# Patient Record
Sex: Female | Born: 1985 | Race: Black or African American | Hispanic: No | Marital: Single | Smoking: Never smoker
Health system: Southern US, Community
[De-identification: ages and names within clinical notes are randomized; demographics above are authoritative.]

---
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
## 2006-03-18 ENCOUNTER — Inpatient Hospital Stay (HOSPITAL_COMMUNITY): Admission: AD | Admit: 2006-03-18 | Discharge: 2006-03-18 | Payer: Self-pay | Admitting: Family Medicine

## 2006-05-12 ENCOUNTER — Inpatient Hospital Stay (HOSPITAL_COMMUNITY): Admission: AD | Admit: 2006-05-12 | Discharge: 2006-05-12 | Payer: Self-pay | Admitting: Obstetrics

## 2006-06-23 ENCOUNTER — Ambulatory Visit (HOSPITAL_COMMUNITY): Admission: RE | Admit: 2006-06-23 | Discharge: 2006-06-23 | Payer: Self-pay | Admitting: Obstetrics & Gynecology

## 2006-06-30 ENCOUNTER — Inpatient Hospital Stay (HOSPITAL_COMMUNITY): Admission: AD | Admit: 2006-06-30 | Discharge: 2006-06-30 | Payer: Self-pay | Admitting: Obstetrics

## 2006-07-13 ENCOUNTER — Ambulatory Visit (HOSPITAL_COMMUNITY): Admission: RE | Admit: 2006-07-13 | Discharge: 2006-07-13 | Payer: Self-pay | Admitting: Obstetrics & Gynecology

## 2006-08-08 ENCOUNTER — Inpatient Hospital Stay (HOSPITAL_COMMUNITY): Admission: AD | Admit: 2006-08-08 | Discharge: 2006-08-08 | Payer: Self-pay | Admitting: Obstetrics & Gynecology

## 2006-08-20 ENCOUNTER — Inpatient Hospital Stay (HOSPITAL_COMMUNITY): Admission: AD | Admit: 2006-08-20 | Discharge: 2006-08-24 | Payer: Self-pay | Admitting: Obstetrics

## 2006-08-21 ENCOUNTER — Encounter: Payer: Self-pay | Admitting: Obstetrics

## 2006-09-03 ENCOUNTER — Emergency Department (HOSPITAL_COMMUNITY): Admission: EM | Admit: 2006-09-03 | Discharge: 2006-09-04 | Payer: Self-pay | Admitting: Emergency Medicine

## 2006-09-03 ENCOUNTER — Emergency Department (HOSPITAL_COMMUNITY): Admission: EM | Admit: 2006-09-03 | Discharge: 2006-09-03 | Payer: Self-pay | Admitting: Emergency Medicine

## 2006-10-15 ENCOUNTER — Emergency Department (HOSPITAL_COMMUNITY): Admission: EM | Admit: 2006-10-15 | Discharge: 2006-10-15 | Payer: Self-pay | Admitting: Emergency Medicine

## 2006-11-16 ENCOUNTER — Emergency Department (HOSPITAL_COMMUNITY): Admission: EM | Admit: 2006-11-16 | Discharge: 2006-11-16 | Payer: Self-pay | Admitting: Emergency Medicine

## 2007-01-03 ENCOUNTER — Emergency Department (HOSPITAL_COMMUNITY): Admission: EM | Admit: 2007-01-03 | Discharge: 2007-01-03 | Payer: Self-pay | Admitting: Emergency Medicine

## 2007-01-15 ENCOUNTER — Encounter: Payer: Self-pay | Admitting: Obstetrics

## 2007-01-15 ENCOUNTER — Ambulatory Visit (HOSPITAL_COMMUNITY): Admission: AD | Admit: 2007-01-15 | Discharge: 2007-01-15 | Payer: Self-pay | Admitting: Obstetrics

## 2007-05-22 ENCOUNTER — Emergency Department (HOSPITAL_COMMUNITY): Admission: EM | Admit: 2007-05-22 | Discharge: 2007-05-23 | Payer: Self-pay | Admitting: Emergency Medicine

## 2007-05-24 ENCOUNTER — Emergency Department (HOSPITAL_COMMUNITY): Admission: EM | Admit: 2007-05-24 | Discharge: 2007-05-24 | Payer: Self-pay | Admitting: Emergency Medicine

## 2007-05-27 ENCOUNTER — Inpatient Hospital Stay (HOSPITAL_COMMUNITY): Admission: AD | Admit: 2007-05-27 | Discharge: 2007-05-27 | Payer: Self-pay | Admitting: Obstetrics & Gynecology

## 2007-06-02 ENCOUNTER — Inpatient Hospital Stay (HOSPITAL_COMMUNITY): Admission: AD | Admit: 2007-06-02 | Discharge: 2007-06-03 | Payer: Self-pay | Admitting: Obstetrics

## 2007-07-27 ENCOUNTER — Inpatient Hospital Stay (HOSPITAL_COMMUNITY): Admission: AD | Admit: 2007-07-27 | Discharge: 2007-07-28 | Payer: Self-pay | Admitting: Obstetrics & Gynecology

## 2007-08-15 ENCOUNTER — Inpatient Hospital Stay (HOSPITAL_COMMUNITY): Admission: AD | Admit: 2007-08-15 | Discharge: 2007-08-15 | Payer: Self-pay | Admitting: Obstetrics & Gynecology

## 2007-12-20 IMAGING — CR DG CHEST 2V
2 series · 2 of 2 positions shown · non-contrast
Comparison: Chest CT 09/03/06

CLINICAL DATA: Fever, body aches.
 CHEST ? 2 VIEW:

[w chest pa]
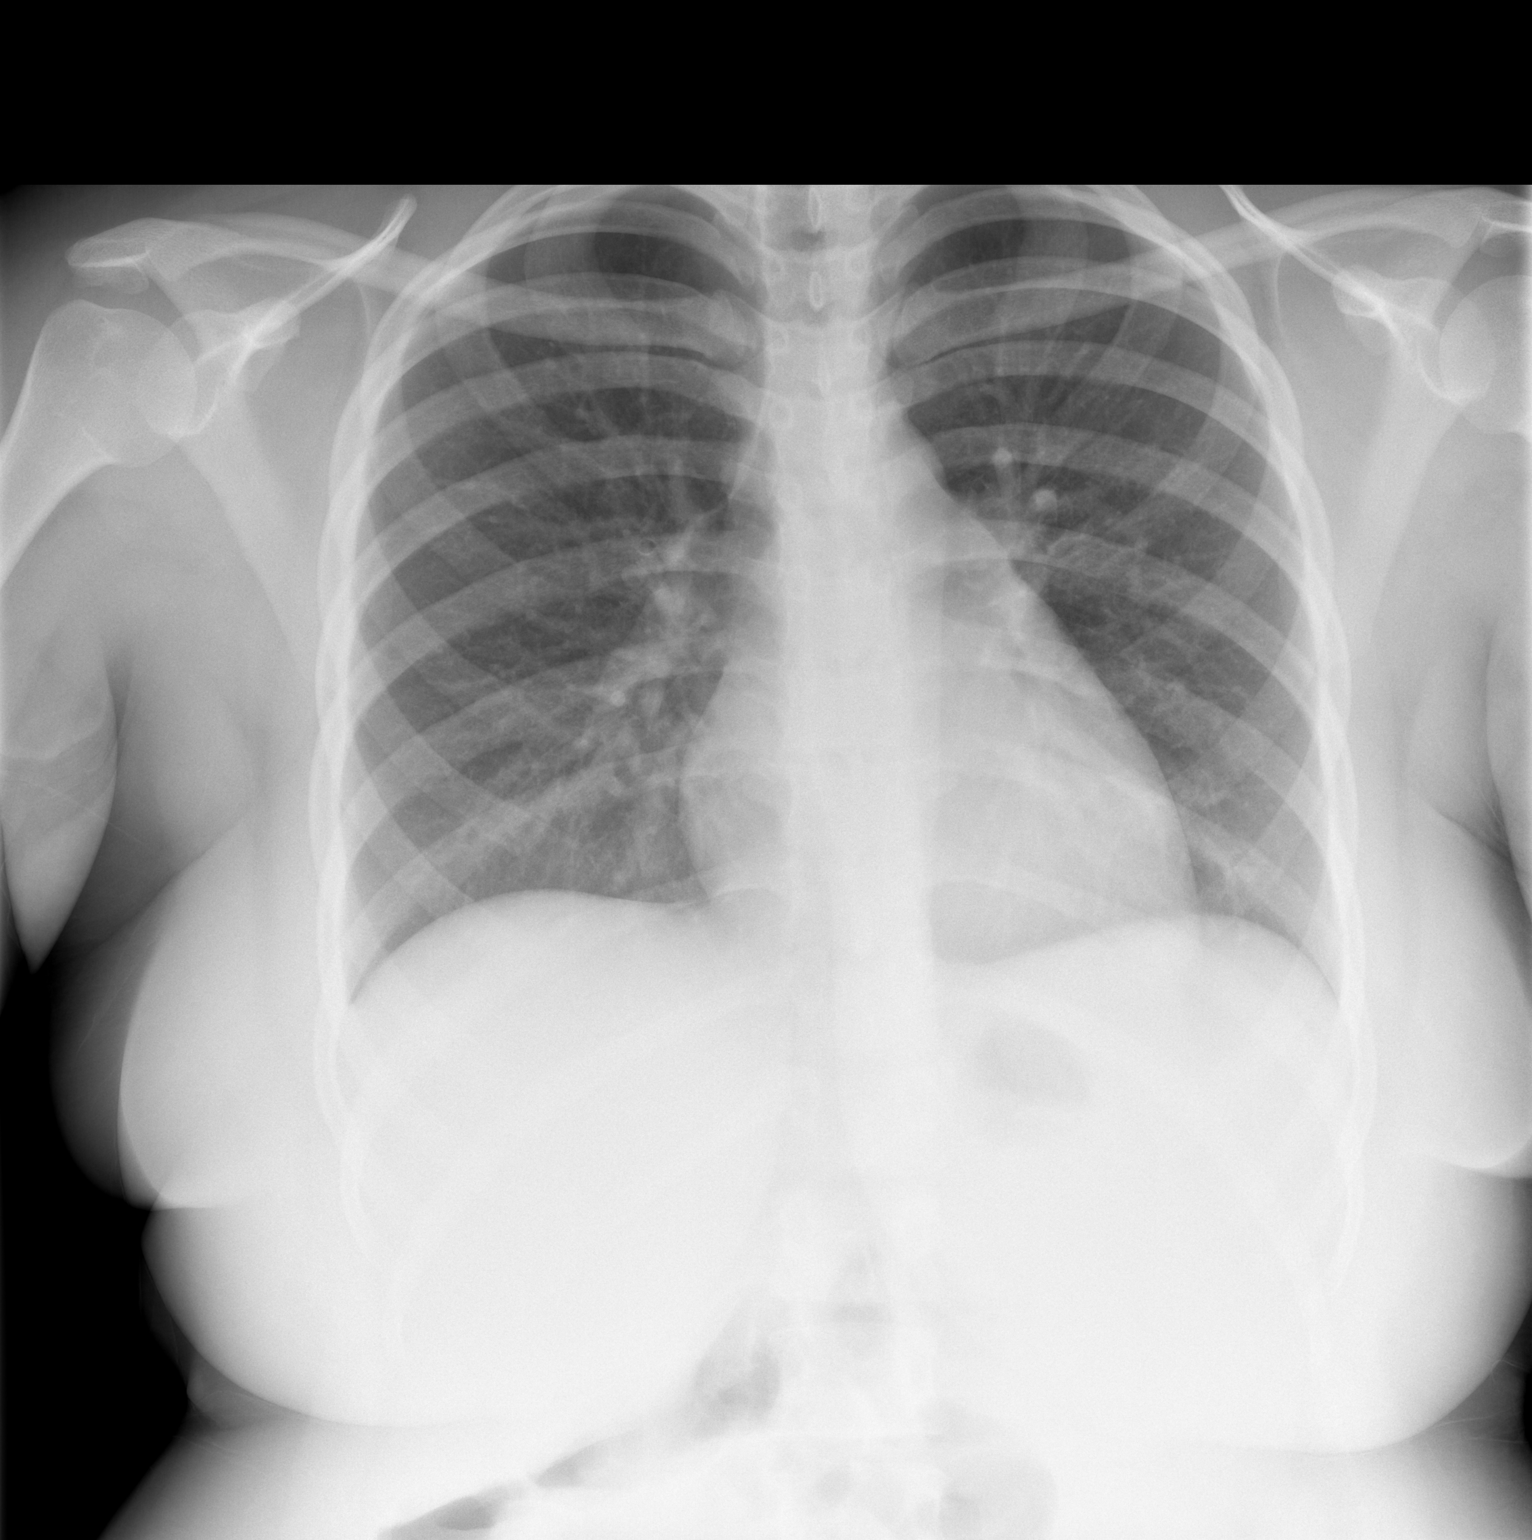

[w chest lat]
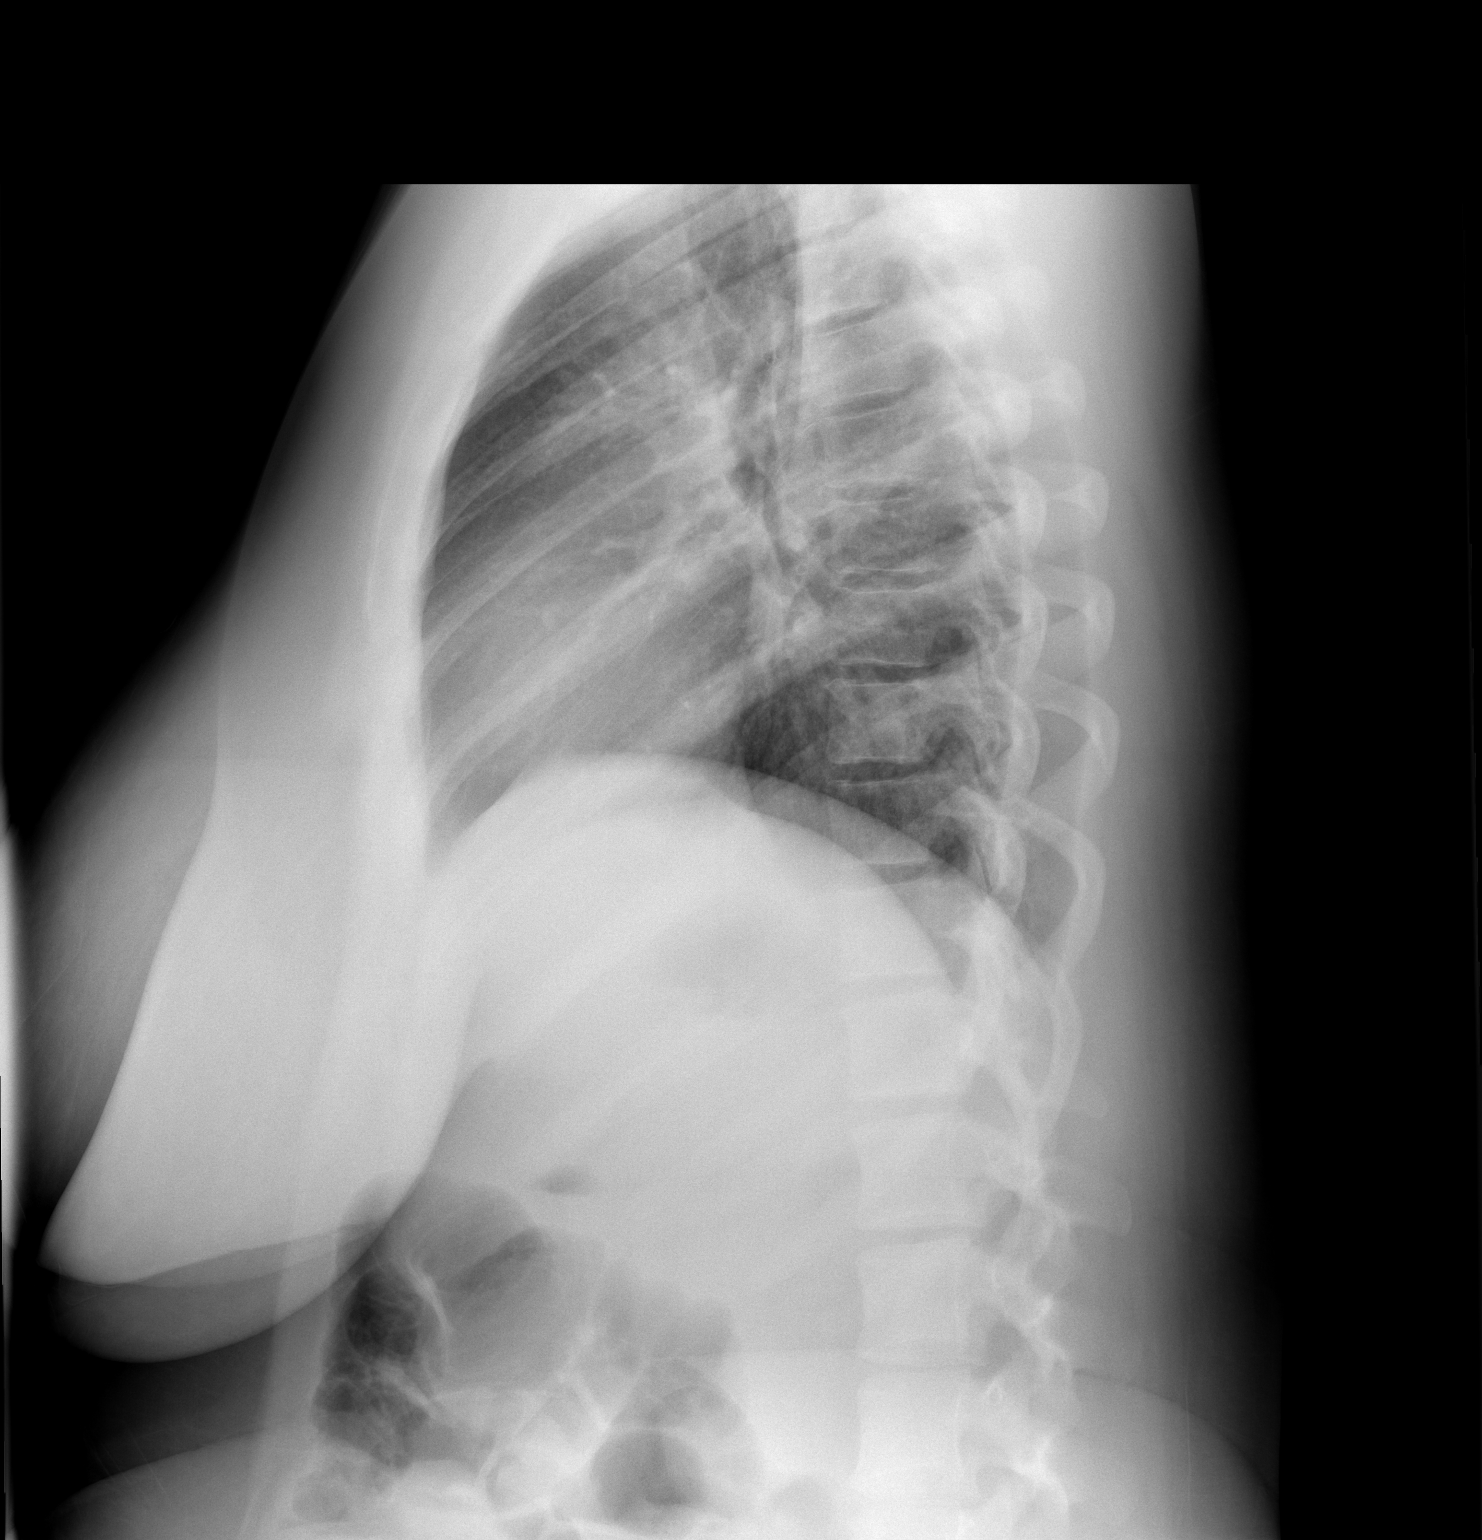

[2 of 2 positions shown; findings below may reference images not displayed]

FINDINGS: Lungs are clear.  Heart size normal.  No effusion or focal bony abnormality.
IMPRESSION: Negative chest.

## 2007-12-20 IMAGING — CT CT ANGIO CHEST
3 of 5 series · 18 of 30 positions shown · IV contrast (120 ML OMNI 300)
Comparison: none

CLINICAL DATA: Post partum in early [REDACTED].  Some shortness of breath.  Elevated D-Dimer.
 CT ANGIOGRAPHY OF CHEST:
TECHNIQUE: Multidetector CT imaging of the chest was performed during bolus injection of intravenous contrast.  Multiplanar CT angiographic image reconstructions were generated to evaluate the vascular anatomy.
 Contrast:  120 cc.

[Series 3: pe · axial · 0.69mm/px · z∈[-219,-29]mm · 9 of 196 slices shown]
[im 22/196  lung]
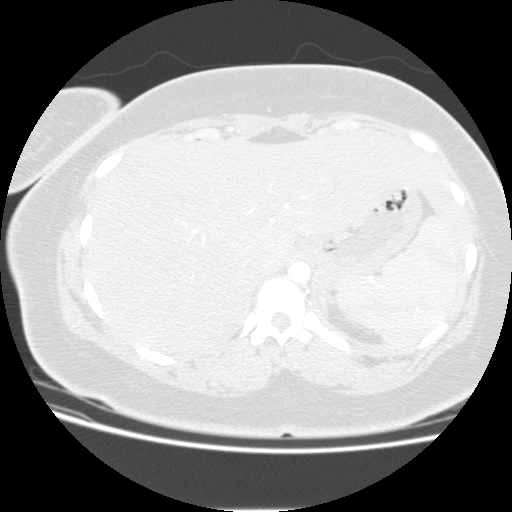
[im 44/196  mediastinal]
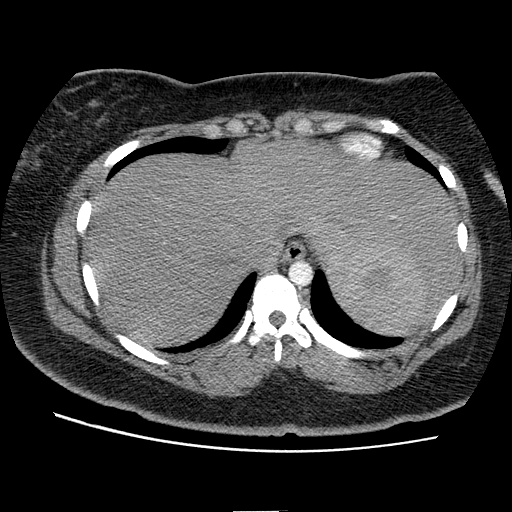
[im 66/196  lung]
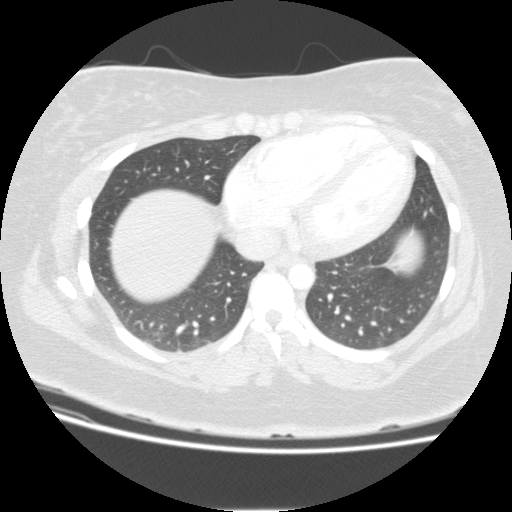
[im 87/196  mediastinal]
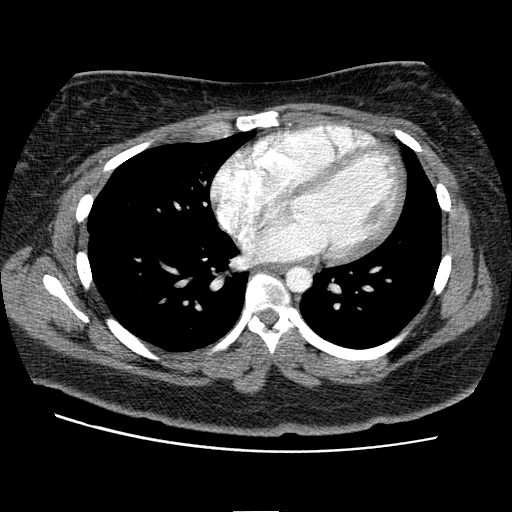
[im 109/196  lung]
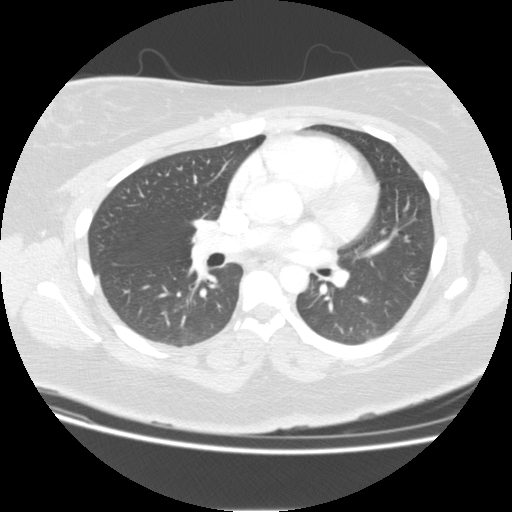
[im 113/196  mediastinal]
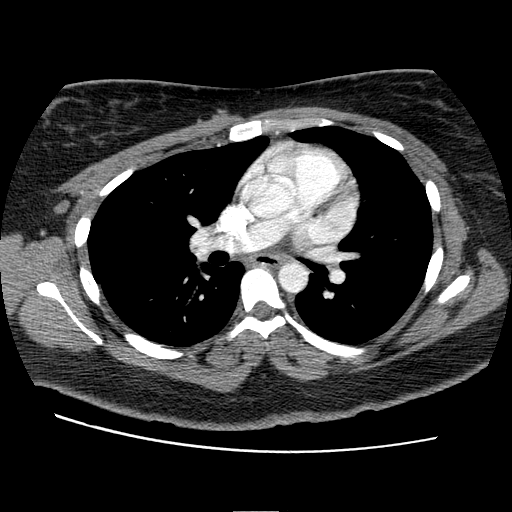
[im 131/196  lung]
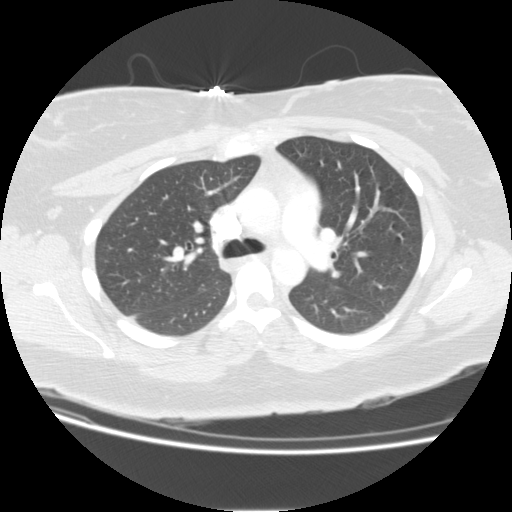
[im 152/196  mediastinal]
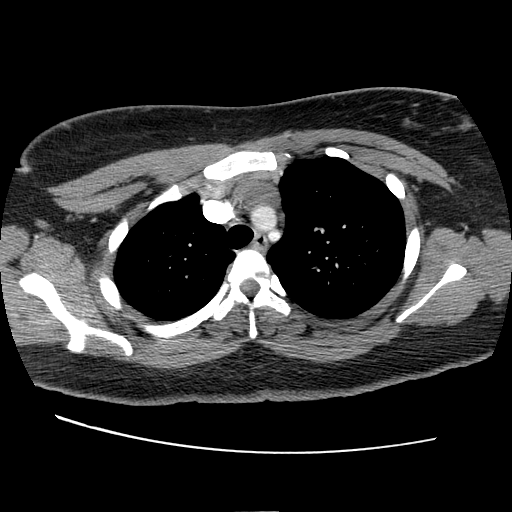
[im 174/196  lung]
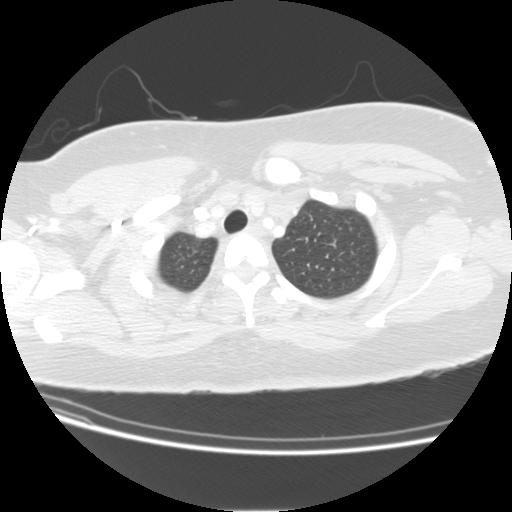

[Series 4: recon 2: pe · axial · 0.69mm/px · z∈[-184,-64]mm · 4 of 98 slices shown]
[im 25/98  lung]
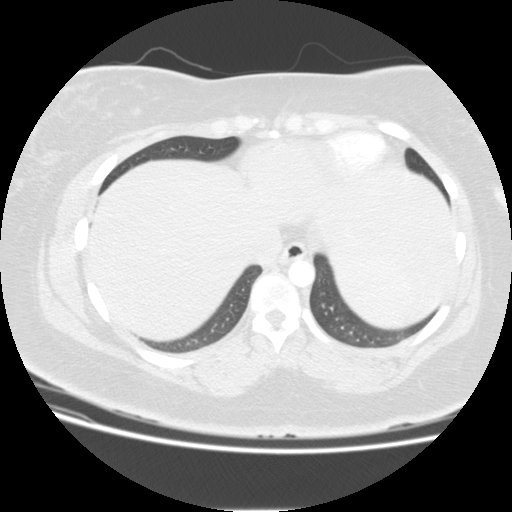
[im 49/98  lung]
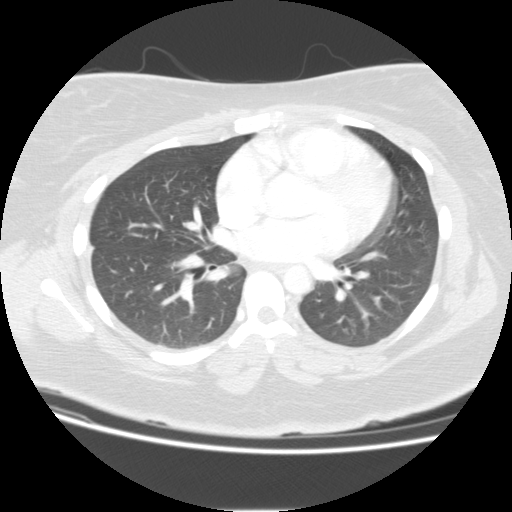
[im 57/98  lung]
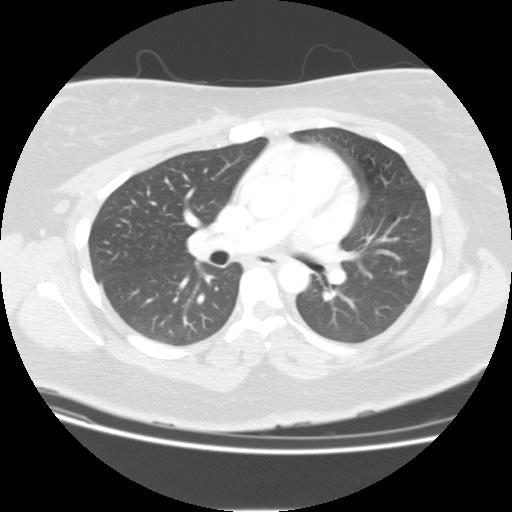
[im 73/98  lung]
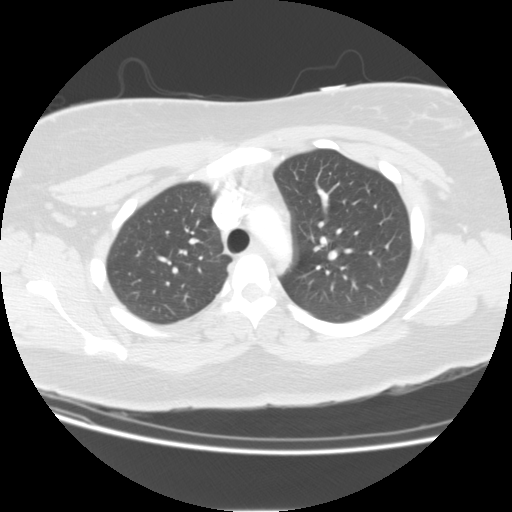

[Series 300: sag angio chest · sagittal · 0.69mm/px · 5 of 135 slices shown]
[im 23/135  lung]
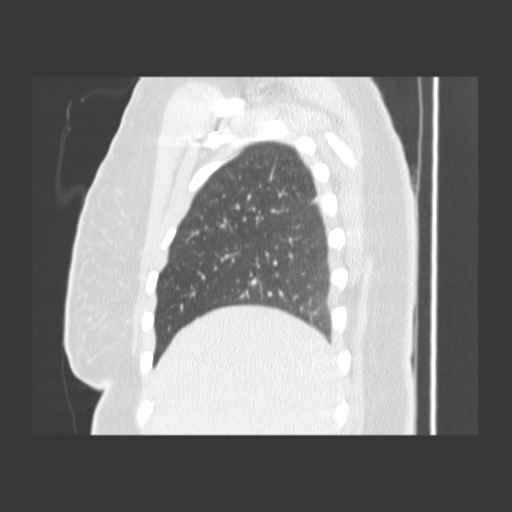
[im 45/135  lung]
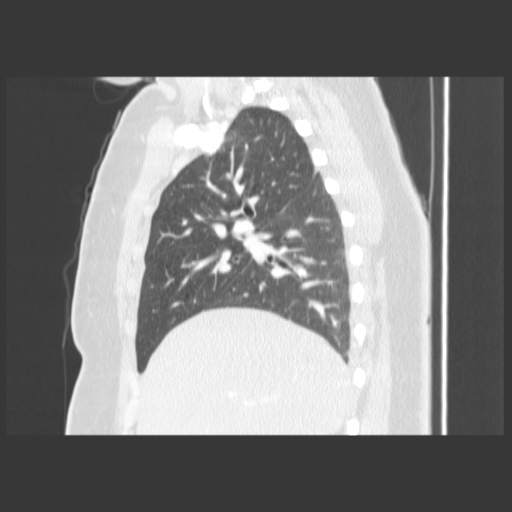
[im 68/135  lung]
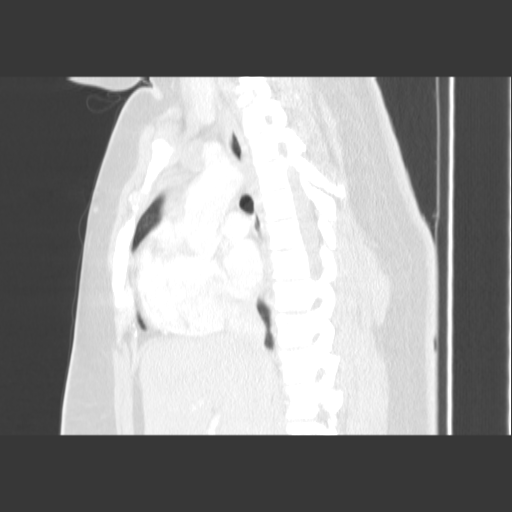
[im 90/135  lung]
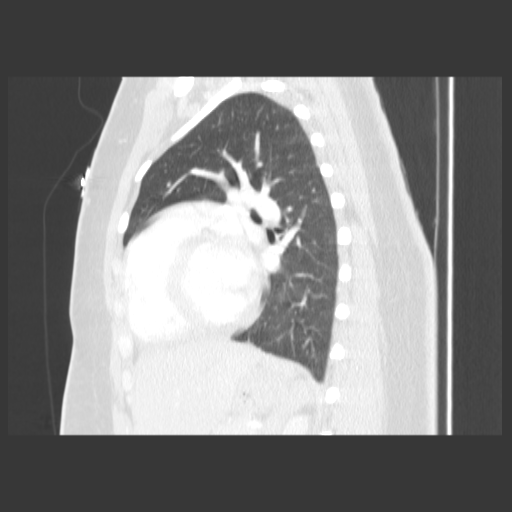
[im 112/135  lung]
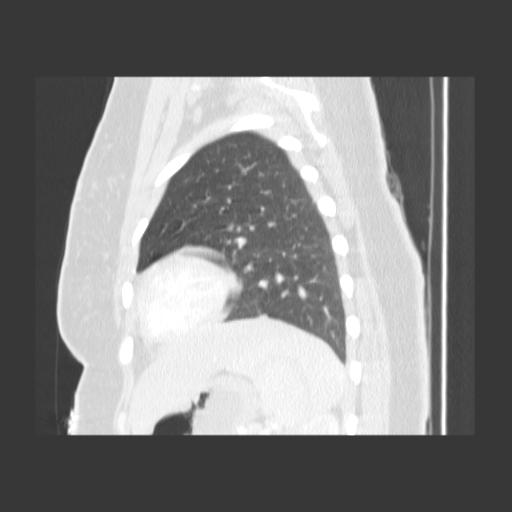

[18 of 30 positions shown; findings below may reference images not displayed]

FINDINGS: On lung window images, no lung parenchymal abnormality is seen.  No effusion is noted.  The pulmonary arteries are relatively well opacified, and there is no evidence of pulmonary embolism.  The thoracic aorta opacifies with no acute abnormality.  The heart is mildly enlarged.  The inhomogeneity of the spleen is most likely due to enhancement pattern.
IMPRESSION: 1. No evidence of pulmonary embolism.
 2. No active infiltrate or effusion. 
 3. Borderline cardiomegaly.

## 2010-02-08 ENCOUNTER — Encounter: Payer: Self-pay | Admitting: Obstetrics & Gynecology

## 2010-06-02 NOTE — Op Note (Signed)
NAMEVIRGILIA, Caroline Henderson             ACCOUNT NO.:  [id number]   MEDICAL RECORD NO.:  [id number]          PATIENT TYPE:  AMB   LOCATION:  SDC                           FACILITY:  [facility]   PHYSICIAN:  Charles A. Clearance Coots, M.D.DATE OF BIRTH:  09-14-1985   DATE OF PROCEDURE:  01/15/2007  DATE OF DISCHARGE:                               OPERATIVE REPORT   PREOPERATIVE DIAGNOSIS:  Spontaneous abortion, incomplete, first  trimester.   POSTOPERATIVE DIAGNOSIS:  Spontaneous abortion, incomplete, first  trimester.   PROCEDURE:  Suction evacuation of retained placenta.   SURGEON:  Charles A. Clearance Coots, M.D.   ANESTHESIA:  MAC, with paracervical block.   ESTIMATED BLOOD LOSS:  100 mL.   COMPLICATIONS:  None.   SPECIMEN:  Products of conception.   OPERATION:  The patient was brought to the operating room, and after  satisfactory IV sedation, the legs were brought up in stirrups, and the  vagina was prepped and draped in the usual sterile fashion.  The urinary  bladder was emptied of approximately 100 mL of clear urine.  Bimanual  examination revealed the uterus to be anteverted, slightly enlarged.  Sterile speculum was inserted in the vaginal vault, and the cervix was  isolated and noted to be open.  The anterior lip of the cervix was  grasped with a ring forceps.  Paracervical block of approximately 10 mL  1% Xylocaine with 1 mL of sodium bicarbonate was placed in each lateral  fornix.  A #12 suction catheter was then easily introduced into the  uterine cavity, and all contents were evacuated.  The endometrial  surface felt gritty at the conclusion of the evacuation procedure.  There was no active bleeding at the conclusion of the procedure.  All  instruments were retired.  The uterus felt firm at the conclusion of the  procedure.  The patient tolerated the procedure well and was transported  to recovery room in satisfactory condition.      Charles A. Clearance Coots, M.D.  Electronically  Signed     CAH/MEDQ  D:  01/15/2007  T:  01/16/2007  Job:  [id number]

## 2010-06-02 NOTE — Discharge Summary (Signed)
Caroline Henderson, Caroline Henderson             ACCOUNT NO.:  [id number]   MEDICAL RECORD NO.:  [id number]          PATIENT TYPE:  INP   LOCATION:  9311                          FACILITY:  [facility]   PHYSICIAN:  Charles A. Clearance Coots, M.D.DATE OF BIRTH:  January 20, 1985   DATE OF ADMISSION:  08/20/2006  DATE OF DISCHARGE:  08/24/2006                               DISCHARGE SUMMARY   ADMITTING DIAGNOSES:  1. [redacted] weeks gestation, active labor, transverse lie, status post      classical cesarean section on August 21, 2006 with delivery of a      viable female at 13.  Apgars of 2 at 1 minute, 3 at 5 minutes, 4 at      10 minutes, 5 at 15 minutes.  2. Neonatal demise.   REASON FOR ADMISSION:  25 year old G2, P1 black female, last menstrual  period February 13, 2006 who presented to triage at [redacted] weeks gestation  with complaint of cramping and vaginal bleeding.  The patient had had  sexual intercourse earlier in the evening and then after dinner noted  the onset of cramping and vaginal bleeding.  She had had episodes of  vaginal bleeding earlier in the pregnancy and was followed at [facility] and had recent ultrasounds at [redacted] weeks gestation  and [redacted] weeks gestation which were within normal limits.   PAST MEDICAL HISTORY:  None.   PAST SURGICAL HISTORY:  None.   MEDICATIONS:  Prenatal vitamins.   ALLERGIES:  NO KNOWN DRUG ALLERGIES   SOCIAL HISTORY:  Single.  Negative tobacco, alcohol, or recreational  drug use.   PHYSICAL EXAMINATION:  GENERAL:  Well-nourished, well-developed female  in no acute distress.  VITAL SIGNS:  She is afebrile, vital signs are stable.  LUNGS:  Clear to auscultation bilaterally.  HEART:  Regular rate and rhythm.  ABDOMEN:  Gravid, nontender.  Cervix fully dilated, 100% effaced, with  bulging membranes.   Ultrasound revealed a transverse lie.  The patient was taken to the  operating room and an urgent classical cesarean section delivery was  performed.  A Viable female  infant was delivered, with Apgars of 2 at 1  minute, 3 at 5 minutes, 4 at 10 minutes, and 5 at 15 minutes.  There  were no intraoperative complications.   Postoperative course was uncomplicated.  There was neonatal demise  several hours after delivery due to prematurity.  The patient was  discharged home on postop day #3 with instructions for future deliveries  being restricted to cesarean section delivery because of the classical  incision in the uterus for this delivery.  She expresses understanding  of these instructions.   DISCHARGE LABORATORY VALUES:  Hemoglobin 9.3, hematocrit 27.9, white  blood cell count 8300, platelets 182,000.   DISCHARGE MEDICATIONS:  1. Tylox and ibuprofen was prescribed for pain.  2. Vistaril was prescribed for anxiety.  3. Continue prenatal vitamins.   DISCHARGE INSTRUCTIONS:  Routine written instructions were given for  discharge after cesarean section, and it was again emphasized to the  patient that she should only have cesarean section deliveries in the  future because  of the classical incision on the uterus with this  delivery.  The patient is to call office for followup appointment in 2  weeks.      Charles A. Clearance Coots, M.D.  Electronically Signed     CAH/MEDQ  D:  08/25/2006  T:  08/25/2006  Job:  [id number]

## 2010-06-05 NOTE — Discharge Summary (Signed)
NAMEAUDIA, Caroline Henderson             ACCOUNT NO.:  [id number]   MEDICAL RECORD NO.:  [id number]          PATIENT TYPE:  INP   LOCATION:  9311                          [facility]ACILITY:  [facility]   PHYSICIAN:  Charles A. Clearance Coots, M.D.DATE OF BIRTH:  02/12/1985   DATE OF ADMISSION:  08/20/2006  DATE OF DISCHARGE:  08/24/2006                               DISCHARGE SUMMARY   ADMITTING DIAGNOSIS:  A 26 weeks' gestation in active labor, transverse  lie.   DISCHARGE DIAGNOSIS:  1. A 26 weeks' gestation in active labor, transverse lie.  2. Status post classical cesarean section on August 21, 2006.  A viable      female delivered at 56.  Apgars of 2 at one minute, 3 at five      minutes 4 at ten minutes and 5 at fifteen minutes with neonatal      demise.  Mother discharged home on postop day #3 in good condition.   REASON FOR ADMISSION:  A 25 year old black female.  Last menstrual  period was February 13, 2006 presented to the hospital at [redacted] weeks  gestation with complaint of vaginal bleeding and cramping after  intercourse.  The patient had intercourse earlier in the evening and  noted the onset of cramping and vaginal bleeding thereafter.  The  patient's prenatal course had been complicated by episodes of vaginal  bleeding.  Ultrasounds which were done at 21 weeks' gestation and at [redacted]  weeks gestation were within normal limits with normal placentation   PAST MEDICAL HISTORY:  Surgery none.  Illnesses none.   MEDICATIONS:  Prenatal vitamins.   ALLERGIES:  No known drug allergies.   SOCIAL HISTORY:  Single negative tobacco, alcohol, or recreational drug  use.   PHYSICAL EXAM:  GENERAL:  A well-nourished, well-developed female no  acute distress.  VITAL SIGNS:  Afebrile.  Vital signs were stable.  LUNGS:  Clear to auscultation bilaterally.  HEART:  Regular rate and rhythm.  ABDOMEN:  Gravid nontender.  CERVICAL EXAM:  Revealed bulging membranes down to the introitus.  Cervix was 100% effaced,  presenting part could not be felt digitally.  Ultrasound was obtained and revealed a transverse lie.  The patient was  taken to the operating room for cesarean section delivery for a 26  weeks' gestation, active labor, transverse lie.   HOSPITAL COURSE:  Classical cesarean section delivery was performed on  August 21, 2006.  There were no intraoperative complications.  Postoperative course was uncomplicated.  The patient was discharged home  on postop day #3 in good condition.  Discharge laboratory values  hemoglobin 9.3, hematocrit 27.9 white blood cell count 8300, platelets  182,000.   DISCHARGE DISPOSITION/MEDICATIONS:  Tylox and ibuprofen were prescribed  for pain and continued prenatal vitamins.  Routine written instructions  were given for discharge after cesarean section.  The patient was  counseled regarding the type of cesarean section that she receive being  a classical scar in the uterus from her  cesarean section; and that all future deliveries should be done by  cesarean section delivery because of the classical approach to the  incision and  this cesarean section with the increased risks of uterine  rupture with any trial of labor for future deliveries.  The patient was  instructed to call office for a followup appointment in 2 weeks.      Charles A. Clearance Coots, M.D.  Electronically Signed     CAH/MEDQ  D:  09/15/2006  T:  09/16/2006  Job:  [id number]

## 2010-10-14 LAB — WET PREP, GENITAL
Trich, Wet Prep: NONE SEEN
Yeast Wet Prep HPF POC: NONE SEEN

## 2010-10-14 LAB — URINALYSIS, ROUTINE W REFLEX MICROSCOPIC
Nitrite: NEGATIVE
Specific Gravity, Urine: >1.03 — ABNORMAL HIGH
Urobilinogen, UA: 0.2

## 2010-10-15 LAB — GC/CHLAMYDIA PROBE AMP, GENITAL
Chlamydia, DNA Probe: NEGATIVE
GC Probe Amp, Genital: NEGATIVE

## 2010-10-16 LAB — URINALYSIS, ROUTINE W REFLEX MICROSCOPIC
Bilirubin Urine: NEGATIVE
Glucose, UA: NEGATIVE
Hgb urine dipstick: NEGATIVE
Ketones, ur: NEGATIVE
pH: 6

## 2010-10-23 LAB — CBC
HCT: 35.4 — ABNORMAL LOW
Hemoglobin: 11.9 — ABNORMAL LOW
MCV: 76.8 — ABNORMAL LOW
RDW: 15.7 — ABNORMAL HIGH

## 2010-10-28 LAB — BASIC METABOLIC PANEL
BUN: 13
Calcium: 9.5
Creatinine, Ser: 0.73
GFR calc Af Amer: >60

## 2010-10-28 LAB — HEPATIC FUNCTION PANEL
ALT: 22
AST: 22
Albumin: 3.9
Alkaline Phosphatase: 65
Bilirubin, Direct: 0.1
Indirect Bilirubin: 0.8
Total Bilirubin: 0.9
Total Protein: 7

## 2010-10-28 LAB — URINE MICROSCOPIC-ADD ON

## 2010-10-28 LAB — URINALYSIS, ROUTINE W REFLEX MICROSCOPIC
Glucose, UA: NEGATIVE
Specific Gravity, Urine: 1.039 — ABNORMAL HIGH
pH: 6

## 2010-10-29 LAB — POCT PREGNANCY, URINE
Operator id: 29459
Preg Test, Ur: NEGATIVE

## 2010-10-30 LAB — I-STAT 8, (EC8 V) (CONVERTED LAB)
BUN: 6
BUN: 9
Bicarbonate: 18.9 — ABNORMAL LOW
Bicarbonate: 23.5
Chloride: 107
Chloride: 108
Glucose, Bld: 114 — ABNORMAL HIGH
Glucose, Bld: 89
HCT: 33 — ABNORMAL LOW
HCT: 35 — ABNORMAL LOW
Hemoglobin: 11.9 — ABNORMAL LOW
Operator id: 151321
Operator id: 257131
Potassium: 3.6
Sodium: 140
TCO2: 19
pCO2, Ven: 17.9 — ABNORMAL LOW
pCO2, Ven: 35.7 — ABNORMAL LOW
pH, Ven: 7.63

## 2010-10-30 LAB — CBC
HCT: 31.4 — ABNORMAL LOW
Hemoglobin: 10.4 — ABNORMAL LOW
MCV: 80.1
RBC: 3.92
WBC: 13.1 — ABNORMAL HIGH

## 2010-10-30 LAB — URINE MICROSCOPIC-ADD ON

## 2010-10-30 LAB — DIFFERENTIAL
Eosinophils Absolute: 0
Eosinophils Relative: 0
Lymphocytes Relative: 5 — ABNORMAL LOW
Lymphs Abs: 0.7
Monocytes Absolute: 0.9 — ABNORMAL HIGH
Monocytes Relative: 7

## 2010-10-30 LAB — POCT PREGNANCY, URINE
Operator id: 151321
Preg Test, Ur: NEGATIVE

## 2010-10-30 LAB — POCT I-STAT CREATININE
Creatinine, Ser: 0.7
Operator id: 151321

## 2010-10-30 LAB — URINALYSIS, ROUTINE W REFLEX MICROSCOPIC
Bilirubin Urine: NEGATIVE
Glucose, UA: NEGATIVE
Specific Gravity, Urine: >1.046 — ABNORMAL HIGH
Urobilinogen, UA: 1

## 2010-10-30 LAB — URINE CULTURE: Colony Count: >=100000

## 2010-11-02 LAB — URINALYSIS, ROUTINE W REFLEX MICROSCOPIC
Bilirubin Urine: NEGATIVE
Glucose, UA: NEGATIVE
Ketones, ur: 15 — AB
Ketones, ur: 15 — AB
Leukocytes, UA: NEGATIVE
Nitrite: NEGATIVE
Specific Gravity, Urine: 1.025
Urobilinogen, UA: 1
pH: 6

## 2010-11-02 LAB — URINE MICROSCOPIC-ADD ON

## 2010-11-02 LAB — CBC
HCT: 27.9 — ABNORMAL LOW
MCHC: 33.5
MCV: 80.9
RBC: 3.45 — ABNORMAL LOW
WBC: 8.3

## 2010-11-02 LAB — FETAL FIBRONECTIN: Fetal Fibronectin: NEGATIVE

## 2010-11-05 LAB — URINALYSIS, ROUTINE W REFLEX MICROSCOPIC
Nitrite: NEGATIVE
Specific Gravity, Urine: >1.03 — ABNORMAL HIGH
Urobilinogen, UA: 0.2

## 2010-11-17 ENCOUNTER — Emergency Department (HOSPITAL_COMMUNITY)
Admission: EM | Admit: 2010-11-17 | Discharge: 2010-11-17 | Disposition: A | Discharge status: Home / Self Care | Payer: Self-pay | Attending: Emergency Medicine | Admitting: Emergency Medicine

## 2010-11-17 DIAGNOSIS — R109 Unspecified abdominal pain: Secondary | ICD-10-CM | POA: Insufficient documentation

## 2010-11-17 DIAGNOSIS — R197 Diarrhea, unspecified: Secondary | ICD-10-CM | POA: Insufficient documentation

## 2010-11-17 LAB — URINE MICROSCOPIC-ADD ON

## 2010-11-17 LAB — URINALYSIS, ROUTINE W REFLEX MICROSCOPIC
Nitrite: NEGATIVE
Protein, ur: NEGATIVE mg/dL
Urobilinogen, UA: 0.2 mg/dL (ref 0.0–1.0)

## 2010-11-17 LAB — CBC
HCT: 33.6 % — ABNORMAL LOW (ref 36.0–46.0)
MCV: 78.9 fL (ref 78.0–100.0)
RDW: 13.9 % (ref 11.5–15.5)
WBC: 9.2 10*3/uL (ref 4.0–10.5)

## 2010-11-17 LAB — COMPREHENSIVE METABOLIC PANEL
Albumin: 3.8 g/dL (ref 3.5–5.2)
BUN: 15 mg/dL (ref 6–23)
Chloride: 103 mEq/L (ref 96–112)
Creatinine, Ser: 0.68 mg/dL (ref 0.50–1.10)
GFR calc Af Amer: >90 mL/min (ref >90)
GFR calc non Af Amer: >90 mL/min (ref >90)
Glucose, Bld: 88 mg/dL (ref 70–99)
Total Bilirubin: 0.4 mg/dL (ref 0.3–1.2)

## 2010-11-17 LAB — LIPASE, BLOOD: Lipase: 22 U/L (ref 11–59)

## 2013-02-20 ENCOUNTER — Encounter (HOSPITAL_COMMUNITY): Payer: Self-pay | Admitting: Emergency Medicine

## 2013-02-20 ENCOUNTER — Emergency Department (HOSPITAL_COMMUNITY)
Admission: EM | Admit: 2013-02-20 | Discharge: 2013-02-21 | Disposition: A | Discharge status: Home / Self Care | Payer: Self-pay | Attending: Emergency Medicine | Admitting: Emergency Medicine

## 2013-02-20 DIAGNOSIS — L0231 Cutaneous abscess of buttock: Secondary | ICD-10-CM | POA: Insufficient documentation

## 2013-02-20 DIAGNOSIS — R000 Tachycardia, unspecified: Secondary | ICD-10-CM | POA: Insufficient documentation

## 2013-02-20 DIAGNOSIS — L03317 Cellulitis of buttock: Principal | ICD-10-CM

## 2013-02-20 NOTE — Discharge Instructions (Signed)
Abscess Care After An abscess (also called a boil or furuncle) is an infected area that contains a collection of pus. Signs and symptoms of an abscess include pain, tenderness, redness, or hardness, or you may feel a moveable soft area under your skin. An abscess can occur anywhere in the body. The infection may spread to surrounding tissues causing cellulitis. A cut (incision) by the surgeon was made over your abscess and the pus was drained out. Gauze may have been packed into the space to provide a drain that will allow the cavity to heal from the inside outwards. The boil may be painful for 5 to 7 days. Most people with a boil do not have high fevers. Your abscess, if seen early, may not have localized, and may not have been lanced. If not, another appointment may be required for this if it does not get better on its own or with medications. HOME CARE INSTRUCTIONS   Only take over-the-counter or prescription medicines for pain, discomfort, or fever as directed by your caregiver.  When you bathe, soak and then remove gauze or iodoform packs at least daily or as directed by your caregiver. You may then wash the wound gently with mild soapy water. Repack with gauze or do as your caregiver directs. SEEK IMMEDIATE MEDICAL CARE IF:   You develop increased pain, swelling, redness, drainage, or bleeding in the wound site.  You develop signs of generalized infection including muscle aches, chills, fever, or a general ill feeling.  An oral temperature above 102 F (38.9 C) develops, not controlled by medication. See your caregiver for a recheck if you develop any of the symptoms described above. If medications (antibiotics) were prescribed, take them as directed. Document Released: 07/23/2004 Document Revised: 03/29/2011 Document Reviewed: 03/20/2007 [facility] Patient Information 2014 [place], [place]. Your abscess was opened and drained, you should not need any further treatment, you can take  over-the-counter ibuprofen or Tylenol for discomfort.  Please apply a warm compress to the area several times a day for 15-20 minutes for the next several days

## 2013-02-20 NOTE — ED Provider Notes (Signed)
CSN: [id number]     Arrival date & time 02/20/13  2307 History  This chart was scribed for non-physician practitioner Arman FilterGail K Undrea Archbold, NP working with Suzi RootsKevin E Steinl, MD by Donne Anonayla Curran, ED Scribe. This patient was seen in room WTR5/WTR5 and the patient's care was started at 2323.    First MD Initiated Contact with Patient 02/20/13 2323     Chief Complaint  Patient presents with  . Abscess    The history is provided by the patient. No language interpreter was used.   HPI Comments: Caroline Henderson is a 28 y.o. female who presents to the Emergency Department complaining of 3 days of a gradual onset, gradually worsening abscess to her right buttock. She has tried warm compresses with little relief. She denies drainage from the area or any other symptoms. She reports her only history of abscess was a single abscess 1 week ago (under her arm) that resolved by itself. She denies a hx of DM.    History reviewed. No pertinent past medical history. Past Surgical History  Procedure Laterality Date  . Cesarean section     No family history on file. History  Substance Use Topics  . Smoking status: Never Smoker   . Smokeless tobacco: Not on file  . Alcohol Use: No   OB History   Grav Para Term Preterm Abortions TAB SAB Ect Mult Living                 Review of Systems  Constitutional: Negative for fever.  Musculoskeletal: Negative for myalgias.  Skin: Positive for color change and wound.  All other systems reviewed and are negative.    Allergies  Review of patient's allergies indicates no known allergies.  Home Medications  No current outpatient prescriptions on file.  BP 106/72  Pulse 112  Temp(Src) 98.4 F (36.9 C) (Oral)  Resp 18  Ht 5\' 4"  (1.626 m)  Wt 230 lb (104.327 kg)  BMI 39.46 kg/m2  SpO2 100%  LMP 02/13/2013  Physical Exam  Nursing note and vitals reviewed. Constitutional: She appears well-developed and well-nourished. No distress.  HENT:  Head:  Normocephalic and atraumatic.  Eyes: Conjunctivae are normal.  Neck: Normal range of motion.  Cardiovascular: Regular rhythm.  Tachycardia present.   Pulmonary/Chest: Effort normal. No respiratory distress.  Musculoskeletal: Normal range of motion. She exhibits no edema and no tenderness.  Neurological: She is alert.  Skin: Skin is warm and dry. There is erythema.  2 cm x 2 cm round firm area with a white center. No fluctuance.   Psychiatric: She has a normal mood and affect. Her behavior is normal.    ED Course  Procedures (including critical care time) DIAGNOSTIC STUDIES: Oxygen Saturation is 100% on RA, normal by my interpretation.    COORDINATION OF CARE: 11:24 PM Discussed treatment plan which includes I&D with pt at bedside and pt agreed to plan. Advised pt to use warm compresses several times a day. Will discharge home with an antibiotic and pain medication.   INCISION AND DRAINAGE Performed by: Arman FilterGail K Lea Baine, NP Consent: Verbal consent obtained. Risks and benefits: risks, benefits and alternatives were discussed Type: abscess  Body area: right buttock  Anesthesia: local infiltration  Incision was made with a scalpel.11 blade  Local anesthetic: lidocaine 1% without epinephrine  Anesthetic total: 2 ml  Complexity: complex Blunt dissection to break up loculations  Drainage: purulent  Drainage amount: moderate  Packing material: none  Patient tolerance: Patient tolerated the procedure  well with no immediate complications.      Labs Review Labs Reviewed - No data to display Imaging Review No results found.  EKG Interpretation   None       MDM   1. Abscess of buttock, right       I personally performed the services described in this documentation, which was scribed in my presence. The recorded information has been reviewed and is accurate.    Arman Filter, NP 02/20/13 [phone]

## 2013-02-20 NOTE — ED Notes (Signed)
Pt c/o Boil to rt buttock that has progressively gotten worse over the last 3 days; pt denies drainage from abscess

## 2013-02-21 MED ORDER — IBUPROFEN 200 MG PO TABS
600.0000 mg | ORAL_TABLET | Freq: Once | ORAL | Status: AC
  Administered 2013-02-21: 600 mg via ORAL
  Filled 2013-02-21: qty 3

## 2013-02-25 NOTE — ED Provider Notes (Signed)
Medical screening examination/treatment/procedure(s) were performed by non-physician practitioner and as supervising physician I was immediately available for consultation/collaboration.  EKG Interpretation   None         Suzi RootsKevin E Maalik Pinn, MD 02/25/13 0830

## 2013-04-12 ENCOUNTER — Ambulatory Visit
Admission: RE | Admit: 2013-04-12 | Discharge: 2013-04-12 | Disposition: A | Discharge status: Home / Self Care | Payer: No Typology Code available for payment source | Source: Ambulatory Visit | Attending: Infectious Disease | Admitting: Infectious Disease

## 2013-04-12 ENCOUNTER — Other Ambulatory Visit: Payer: Self-pay | Admitting: Infectious Disease

## 2013-04-12 DIAGNOSIS — A150 Tuberculosis of lung: Secondary | ICD-10-CM

## 2015-07-27 ENCOUNTER — Encounter (HOSPITAL_COMMUNITY): Payer: Self-pay | Admitting: Oncology

## 2015-07-27 ENCOUNTER — Emergency Department (HOSPITAL_COMMUNITY)
Admission: EM | Admit: 2015-07-27 | Discharge: 2015-07-27 | Disposition: A | Discharge status: Home / Self Care | Payer: No Typology Code available for payment source | Attending: Emergency Medicine | Admitting: Emergency Medicine

## 2015-07-27 DIAGNOSIS — Y999 Unspecified external cause status: Secondary | ICD-10-CM | POA: Insufficient documentation

## 2015-07-27 DIAGNOSIS — Y92410 Unspecified street and highway as the place of occurrence of the external cause: Secondary | ICD-10-CM | POA: Diagnosis not present

## 2015-07-27 DIAGNOSIS — Y939 Activity, unspecified: Secondary | ICD-10-CM | POA: Diagnosis not present

## 2015-07-27 DIAGNOSIS — S39012A Strain of muscle, fascia and tendon of lower back, initial encounter: Secondary | ICD-10-CM | POA: Insufficient documentation

## 2015-07-27 DIAGNOSIS — S3992XA Unspecified injury of lower back, initial encounter: Secondary | ICD-10-CM | POA: Diagnosis present

## 2015-07-27 MED ORDER — METHOCARBAMOL 500 MG PO TABS: 500.0000 mg | ORAL_TABLET | Freq: Two times a day (BID) | ORAL | Status: AC | PRN

## 2015-07-27 MED ORDER — NAPROXEN 500 MG PO TABS
500.0000 mg | ORAL_TABLET | Freq: Once | ORAL | Status: AC
  Administered 2015-07-27: 500 mg via ORAL
  Filled 2015-07-27: qty 1

## 2015-07-27 MED ORDER — NAPROXEN 500 MG PO TABS: 500.0000 mg | ORAL_TABLET | Freq: Two times a day (BID) | ORAL | Status: AC

## 2015-07-27 NOTE — Discharge Instructions (Signed)
Lumbosacral Strain °Lumbosacral strain is a strain of any of the parts that make up your lumbosacral vertebrae. Your lumbosacral vertebrae are the bones that make up the lower third of your backbone. Your lumbosacral vertebrae are held together by muscles and tough, fibrous tissue (ligaments).  °CAUSES  °A sudden blow to your back can cause lumbosacral strain. Also, anything that causes an excessive stretch of the muscles in the low back can cause this strain. This is typically seen when people exert themselves strenuously, fall, lift heavy objects, bend, or crouch repeatedly. °RISK FACTORS °· Physically demanding work. °· Participation in pushing or pulling sports or sports that require a sudden twist of the back (tennis, golf, baseball). °· Weight lifting. °· Excessive lower back curvature. °· Forward-tilted pelvis. °· Weak back or abdominal muscles or both. °· Tight hamstrings. °SIGNS AND SYMPTOMS  °Lumbosacral strain may cause pain in the area of your injury or pain that moves (radiates) down your leg.  °DIAGNOSIS °Your health care provider can often diagnose lumbosacral strain through a physical exam. In some cases, you may need tests such as X-ray exams.  °TREATMENT  °Treatment for your lower back injury depends on many factors that your clinician will have to evaluate. However, most treatment will include the use of anti-inflammatory medicines. °HOME CARE INSTRUCTIONS  °· Avoid hard physical activities (tennis, racquetball, waterskiing) if you are not in proper physical condition for it. This may aggravate or create problems. °· If you have a back problem, avoid sports requiring sudden body movements. Swimming and walking are generally safer activities. °· Maintain good posture. °· Maintain a healthy weight. °· For acute conditions, you may put ice on the injured area. °· Put ice in a plastic bag. °· Place a towel between your skin and the bag. °· Leave the ice on for 20 minutes, 2-3 times a day. °· When the  low back starts healing, stretching and strengthening exercises may be recommended. °SEEK MEDICAL CARE IF: °· Your back pain is getting worse. °· You experience severe back pain not relieved with medicines. °SEEK IMMEDIATE MEDICAL CARE IF:  °· You have numbness, tingling, weakness, or problems with the use of your arms or legs. °· There is a change in bowel or bladder control. °· You have increasing pain in any area of the body, including your belly (abdomen). °· You notice shortness of breath, dizziness, or feel faint. °· You feel sick to your stomach (nauseous), are throwing up (vomiting), or become sweaty. °· You notice discoloration of your toes or legs, or your feet get very cold. °MAKE SURE YOU:  °· Understand these instructions. °· Will watch your condition. °· Will get help right away if you are not doing well or get worse. °  °This information is not intended to replace advice given to you by your health care provider. Make sure you discuss any questions you have with your health care provider. °  °Document Released: 10/14/2004 Document Revised: 01/25/2014 Document Reviewed: 08/23/2012 °Elsevier Interactive Patient Education ©2016 [employer] Inc. ° °Motor Vehicle Collision °It is common to have multiple bruises and sore muscles after a motor vehicle collision (MVC). These tend to feel worse for the first 24 hours. You may have the most stiffness and soreness over the first several hours. You may also feel worse when you wake up the first morning after your collision. After this point, you will usually begin to improve with each day. The speed of improvement often depends on the severity of the   collision, the number of injuries, and the location and nature of these injuries. °HOME CARE INSTRUCTIONS °· Put ice on the injured area. °¨ Put ice in a plastic bag. °¨ Place a towel between your skin and the bag. °¨ Leave the ice on for 15-20 minutes, 3-4 times a day, or as directed by your health care  provider. °· Drink enough fluids to keep your urine clear or pale yellow. Do not drink alcohol. °· Take a warm shower or bath once or twice a day. This will increase blood flow to sore muscles. °· You may return to activities as directed by your caregiver. Be careful when lifting, as this may aggravate neck or back pain. °· Only take over-the-counter or prescription medicines for pain, discomfort, or fever as directed by your caregiver. Do not use aspirin. This may increase bruising and bleeding. °SEEK IMMEDIATE MEDICAL CARE IF: °· You have numbness, tingling, or weakness in the arms or legs. °· You develop severe headaches not relieved with medicine. °· You have severe neck pain, especially tenderness in the middle of the back of your neck. °· You have changes in bowel or bladder control. °· There is increasing pain in any area of the body. °· You have shortness of breath, light-headedness, dizziness, or fainting. °· You have chest pain. °· You feel sick to your stomach (nauseous), throw up (vomit), or sweat. °· You have increasing abdominal discomfort. °· There is blood in your urine, stool, or vomit. °· You have pain in your shoulder (shoulder strap areas). °· You feel your symptoms are getting worse. °MAKE SURE YOU: °· Understand these instructions. °· Will watch your condition. °· Will get help right away if you are not doing well or get worse. °  °This information is not intended to replace advice given to you by your health care provider. Make sure you discuss any questions you have with your health care provider. °  °Document Released: 01/04/2005 Document Revised: 01/25/2014 Document Reviewed: 06/03/2010 °Elsevier Interactive Patient Education ©2016 Elsevier Inc. ° °
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
## 2015-07-27 NOTE — ED Notes (Signed)
Pt reports understanding of discharge information. No questions at time of discharge 
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
## 2022-06-07 ENCOUNTER — Telehealth: Payer: Self-pay | Admitting: Nurse Practitioner

## 2022-06-07 ENCOUNTER — Encounter: Payer: Self-pay | Admitting: Nurse Practitioner

## 2022-06-07 NOTE — Progress Notes (Signed)
No show for video visit
